# Patient Record
Sex: Male | Born: 1970 | Race: Black or African American | Hispanic: No | Marital: Single | Smoking: Never smoker
Health system: Southern US, Community
[De-identification: ages and names within clinical notes are randomized; demographics above are authoritative.]

## PROBLEM LIST (undated history)

## (undated) DIAGNOSIS — R1900 Intra-abdominal and pelvic swelling, mass and lump, unspecified site: Secondary | ICD-10-CM

## (undated) DIAGNOSIS — R1012 Left upper quadrant pain: Secondary | ICD-10-CM

## (undated) DIAGNOSIS — D1739 Benign lipomatous neoplasm of skin and subcutaneous tissue of other sites: Secondary | ICD-10-CM

## (undated) DIAGNOSIS — I10 Essential (primary) hypertension: Secondary | ICD-10-CM

## (undated) HISTORY — DX: Benign lipomatous neoplasm of skin and subcutaneous tissue of other sites: D17.39

## (undated) HISTORY — DX: Intra-abdominal and pelvic swelling, mass and lump, unspecified site: R19.00

## (undated) HISTORY — DX: Left upper quadrant pain: R10.12

## (undated) HISTORY — DX: Essential (primary) hypertension: I10

---

## 2004-11-22 HISTORY — PX: HAND SURGERY: SHX662

## 2012-03-23 ENCOUNTER — Ambulatory Visit (INDEPENDENT_AMBULATORY_CARE_PROVIDER_SITE_OTHER): Payer: Self-pay | Admitting: Surgery

## 2012-03-27 ENCOUNTER — Encounter (INDEPENDENT_AMBULATORY_CARE_PROVIDER_SITE_OTHER): Payer: Self-pay | Admitting: Surgery

## 2012-03-30 ENCOUNTER — Encounter (INDEPENDENT_AMBULATORY_CARE_PROVIDER_SITE_OTHER): Payer: Self-pay | Admitting: Surgery

## 2012-03-30 ENCOUNTER — Ambulatory Visit (INDEPENDENT_AMBULATORY_CARE_PROVIDER_SITE_OTHER): Payer: BC Managed Care – PPO | Admitting: Surgery

## 2012-03-30 VITALS — BP 122/78 | HR 80 | Resp 18 | Ht 67.0 in | Wt 233.6 lb

## 2012-03-30 DIAGNOSIS — R229 Localized swelling, mass and lump, unspecified: Secondary | ICD-10-CM

## 2012-03-30 DIAGNOSIS — R222 Localized swelling, mass and lump, trunk: Secondary | ICD-10-CM

## 2012-03-30 NOTE — Progress Notes (Signed)
Patient ID: Wayne White [facility], male   DOB: 02-22-1971, 41 y.o.   MRN: [id number]  Chief Complaint  Patient presents with  . Other    Eval lipoma on back    HPI Wayne White is a 41 y.o. male.   HPIThis is a pleasant gentleman referred by Dr. Katherine Mantle for evaluation of a large left back mass. The mass has been there for several years but is now quickly getting larger. It causes him a significant amount of discomfort. He describes the pain as an ache. It is not referring where else. He is otherwise without complaints. He has had no recent weight loss.  Past Medical History  Diagnosis Date  . Hypertension   . Abdominal pain, left upper quadrant   . Abdominal or pelvic swelling, mass or lump, unspecified site   . 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 Hand surgery     History reviewed. No pertinent family history.  Social History History  Substance Use Topics  . Smoking status: Never Smoker   . Smokeless tobacco: Not on file  . Alcohol Use: No    No Known Allergies  Current Outpatient Prescriptions  Medication Sig Dispense Refill  . amLODipine (NORVASC) 10 MG tablet Take 10 mg by mouth daily.      . metoprolol (LOPRESSOR) 50 MG tablet Take 100 mg by mouth 2 (two) times daily.       . hydrochlorothiazide (HYDRODIURIL) 25 MG tablet Take 25 mg by mouth daily.        Review of Systems Review of Systems  Constitutional: Negative for fever, chills and unexpected weight change.  HENT: Negative for hearing loss, congestion, sore throat, trouble swallowing and voice change.   Eyes: Negative for visual disturbance.  Respiratory: Negative for cough and wheezing.   Cardiovascular: Negative for chest pain, palpitations and leg swelling.  Gastrointestinal: Negative for nausea, vomiting, abdominal pain, diarrhea, constipation, blood in stool, abdominal distention, anal bleeding and rectal pain.  Genitourinary: Negative for hematuria and difficulty  urinating.  Musculoskeletal: Positive for back pain. Negative for arthralgias.  Skin: Negative for rash and wound.  Neurological: Negative for seizures, syncope, weakness and headaches.  Hematological: Negative for adenopathy. Does not bruise/bleed easily.  Psychiatric/Behavioral: Negative for confusion.    Blood pressure 122/78, pulse 80, resp. rate 18, height 5\' 7"  (1.702 m), weight 233 lb 9.6 oz (105.96 kg).  Physical Exam Physical Exam  Constitutional: He is oriented to person, place, and time. He appears well-developed and well-nourished. No distress.  HENT:  Head: Normocephalic and atraumatic.  Right Ear: External ear normal.  Left Ear: External ear normal.  Nose: Nose normal.  Mouth/Throat: Oropharynx is clear and moist. No oropharyngeal exudate.  Eyes: Conjunctivae are normal. Pupils are equal, round, and reactive to light. No scleral icterus.  Neck: Normal range of motion. No tracheal deviation present. No thyromegaly present.  Cardiovascular: Normal rate, regular rhythm, normal heart sounds and intact distal pulses.   No murmur heard. Pulmonary/Chest: Effort normal and breath sounds normal. No respiratory distress. He has no wheezes. He has no rales.  Abdominal: Soft. Bowel sounds are normal. He exhibits no distension. There is no tenderness. There is no rebound.  Musculoskeletal: Normal range of motion. He exhibits no edema and no tenderness.  Lymphadenopathy:    He has no cervical adenopathy.  Neurological: He is alert and oriented to person, place, and time.  Skin: Skin is warm  and dry. No rash noted. No erythema.  Psychiatric: His behavior is normal. Judgment normal.  Back: There is a large 8 cm slightly firm mass of left mid back. It is not mobile. There is no fluctuance or erythema  Data Reviewed   Assessment    8 cm left back mass of uncertain etiology    Plan    Removal in the operating room given its size and potential for malignancy is recommended. I  discussed the surgery with him in detail. I discussed the risk of surgery which includes but is not limited to bleeding, infection, recurrence, seroma formation, etc. I believe he will need to be out of work approximately 2 weeks postoperatively for the wound to heal correctly. Surgery will be scheduled. Likelihood of success is good       Wayne White A 03/30/2012, 9:19 AM

## 2012-04-28 ENCOUNTER — Encounter (HOSPITAL_COMMUNITY): Payer: Self-pay | Admitting: Pharmacy Technician

## 2012-05-02 ENCOUNTER — Encounter (HOSPITAL_COMMUNITY): Payer: Self-pay

## 2012-05-02 ENCOUNTER — Encounter (HOSPITAL_COMMUNITY)
Admission: RE | Admit: 2012-05-02 | Discharge: 2012-05-02 | Disposition: A | Discharge status: Home / Self Care | Payer: BC Managed Care – PPO | Source: Ambulatory Visit | Attending: Surgery | Admitting: Surgery

## 2012-05-02 ENCOUNTER — Ambulatory Visit (HOSPITAL_COMMUNITY)
Admission: RE | Admit: 2012-05-02 | Discharge: 2012-05-02 | Disposition: A | Discharge status: Home / Self Care | Payer: BC Managed Care – PPO | Source: Ambulatory Visit | Attending: Surgery | Admitting: Surgery

## 2012-05-02 ENCOUNTER — Other Ambulatory Visit: Payer: Self-pay

## 2012-05-02 DIAGNOSIS — I517 Cardiomegaly: Secondary | ICD-10-CM | POA: Insufficient documentation

## 2012-05-02 DIAGNOSIS — R911 Solitary pulmonary nodule: Secondary | ICD-10-CM | POA: Insufficient documentation

## 2012-05-02 DIAGNOSIS — L989 Disorder of the skin and subcutaneous tissue, unspecified: Secondary | ICD-10-CM | POA: Insufficient documentation

## 2012-05-02 DIAGNOSIS — Z01810 Encounter for preprocedural cardiovascular examination: Secondary | ICD-10-CM | POA: Insufficient documentation

## 2012-05-02 DIAGNOSIS — Z01812 Encounter for preprocedural laboratory examination: Secondary | ICD-10-CM | POA: Insufficient documentation

## 2012-05-02 LAB — CBC
HCT: 42 % (ref 39.0–52.0)
Hemoglobin: 13.9 g/dL (ref 13.0–17.0)
MCH: 28.7 pg (ref 26.0–34.0)
MCV: 86.8 fL (ref 78.0–100.0)
RBC: 4.84 MIL/uL (ref 4.22–5.81)

## 2012-05-02 LAB — BASIC METABOLIC PANEL
CO2: 28 mEq/L (ref 19–32)
Calcium: 9.3 mg/dL (ref 8.4–10.5)
Creatinine, Ser: 1.31 mg/dL (ref 0.50–1.35)
Glucose, Bld: 88 mg/dL (ref 70–99)

## 2012-05-02 NOTE — Patient Instructions (Signed)
[address] [facility]  05/02/2012   Your procedure is scheduled on:  05/11/12  Thursday  Surgery  [id number]  Report to [facility] at 0515      AM.  Call this number if you have problems the morning of surgery: [phone]     Or PST   [id number]  [facility]   Remember:   Do not eat food or fluids :After Midnight.  Wednesday NIGHT     Take these medicines the morning of surgery with A SIP OF WATER:   AMLODIPINE, METOPROLOL   Do not wear jewelry, make-up or nail polish.  Do not wear lotions, powders, or perfumes. You may wear deodorant.  Do not shave 48 hours prior to surgery.  Do not bring valuables to the hospital.  Contacts, dentures or bridgework may not be worn into surgery.  Leave suitcase in the car. After surgery it may be brought to your room.  For patients admitted to the hospital, checkout time is 11:00 AM the day of discharge.   Patients discharged the day of surgery will not be allowed to drive home.  Name and phone number of your driver:      father                                                                Special Instructions: CHG Shower Use Special Wash: 1/2 bottle night before surgery and 1/2 bottle morning of surgery. REGULAR SOAP FACE AND PRIVATES              L              MEN-MAY SHAVE FACE MORNING OF SURGERY  Please read over the following fact sheets that you were given: MRSA Information

## 2012-05-02 NOTE — Pre-Procedure Instructions (Signed)
Spoke with Britta Mccreedy at [facility] and notified her to have Dr Magnus Ivan review abnormal chest x ray- states he is out of office until 05/08/12 and will review when he returns

## 2012-05-10 NOTE — H&P (Signed)
Patient ID: Wayne White, male DOB: October 10, 1971, 41 y.o. MRN: [id number]  Chief Complaint   Patient presents with   .  Other     Eval lipoma on back    HPI  Wayne White is White 41 y.o. male.  HPIThis is White pleasant gentleman referred by Dr. Katherine Mantle for evaluation of White large left back mass. The mass has been there for several years but is now quickly getting larger. It causes him White significant amount of discomfort. He describes the pain as an ache. It is not referring where else. He is otherwise without complaints. He has had no recent weight loss.  Past Medical History   Diagnosis  Date   .  Hypertension    .  Abdominal pain, left upper quadrant    .  Abdominal or pelvic swelling, mass or lump, unspecified site    .  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  Hand surgery     History reviewed. No pertinent family history.  Social History  History   Substance Use Topics   .  Smoking status:  Never Smoker   .  Smokeless tobacco:  Not on file   .  Alcohol Use:  No    No Known Allergies  Current Outpatient Prescriptions   Medication  Sig  Dispense  Refill   .  amLODipine (NORVASC) 10 MG tablet  Take 10 mg by mouth daily.     .  metoprolol (LOPRESSOR) 50 MG tablet  Take 100 mg by mouth 2 (two) times daily.     .  hydrochlorothiazide (HYDRODIURIL) 25 MG tablet  Take 25 mg by mouth daily.      Review of Systems  Review of Systems  Constitutional: Negative for fever, chills and unexpected weight change.  HENT: Negative for hearing loss, congestion, sore throat, trouble swallowing and voice change.  Eyes: Negative for visual disturbance.  Respiratory: Negative for cough and wheezing.  Cardiovascular: Negative for chest pain, palpitations and leg swelling.  Gastrointestinal: Negative for nausea, vomiting, abdominal pain, diarrhea, constipation, blood in stool, abdominal distention, anal bleeding and rectal pain.  Genitourinary: Negative for  hematuria and difficulty urinating.  Musculoskeletal: Positive for back pain. Negative for arthralgias.  Skin: Negative for rash and wound.  Neurological: Negative for seizures, syncope, weakness and headaches.  Hematological: Negative for adenopathy. Does not bruise/bleed easily.  Psychiatric/Behavioral: Negative for confusion.   Blood pressure 122/78, pulse 80, resp. rate 18, height 5\' 7"  (1.702 m), weight 233 lb 9.6 oz (105.96 kg).  Physical Exam  Physical Exam  Constitutional: He is oriented to person, place, and time. He appears well-developed and well-nourished. No distress.  HENT:  Head: Normocephalic and atraumatic.  Right Ear: External ear normal.  Left Ear: External ear normal.  Nose: Nose normal.  Mouth/Throat: Oropharynx is clear and moist. No oropharyngeal exudate.  Eyes: Conjunctivae are normal. Pupils are equal, round, and reactive to light. No scleral icterus.  Neck: Normal range of motion. No tracheal deviation present. No thyromegaly present.  Cardiovascular: Normal rate, regular rhythm, normal heart sounds and intact distal pulses.  No murmur heard.  Pulmonary/Chest: Effort normal and breath sounds normal. No respiratory distress. He has no wheezes. He has no rales.  Abdominal: Soft. Bowel sounds are normal. He exhibits no distension. There is no tenderness. There is no rebound.  Musculoskeletal: Normal range of motion. He exhibits no edema and  no tenderness.  Lymphadenopathy:  He has no cervical adenopathy.  Neurological: He is alert and oriented to person, place, and time.  Skin: Skin is warm and dry. No rash noted. No erythema.  Psychiatric: His behavior is normal. Judgment normal.  Back: There is White large 8 cm slightly firm mass of left mid back. It is not mobile. There is no fluctuance or erythema  Data Reviewed  Assessment   8 cm left back mass of uncertain etiology   Plan   Removal in the operating room given its size and potential for malignancy is  recommended. I discussed the surgery with him in detail. I discussed the risk of surgery which includes but is not limited to bleeding, infection, recurrence, seroma formation, etc. I believe he will need to be out of work approximately 2 weeks postoperatively for the wound to heal correctly. Surgery will be scheduled. Likelihood of success is good   Wayne White

## 2012-05-11 ENCOUNTER — Encounter (HOSPITAL_COMMUNITY)
Admission: RE | Disposition: A | Discharge status: Home / Self Care | Payer: Self-pay | Source: Ambulatory Visit | Attending: Surgery

## 2012-05-11 ENCOUNTER — Encounter (HOSPITAL_COMMUNITY): Payer: Self-pay | Admitting: Anesthesiology

## 2012-05-11 ENCOUNTER — Ambulatory Visit (HOSPITAL_COMMUNITY): Payer: BC Managed Care – PPO | Admitting: Anesthesiology

## 2012-05-11 ENCOUNTER — Ambulatory Visit (HOSPITAL_COMMUNITY)
Admission: RE | Admit: 2012-05-11 | Discharge: 2012-05-11 | Disposition: A | Discharge status: Home / Self Care | Payer: BC Managed Care – PPO | Source: Ambulatory Visit | Attending: Surgery | Admitting: Surgery

## 2012-05-11 ENCOUNTER — Encounter (HOSPITAL_COMMUNITY): Payer: Self-pay | Admitting: *Deleted

## 2012-05-11 DIAGNOSIS — I10 Essential (primary) hypertension: Secondary | ICD-10-CM | POA: Insufficient documentation

## 2012-05-11 DIAGNOSIS — R1012 Left upper quadrant pain: Secondary | ICD-10-CM | POA: Insufficient documentation

## 2012-05-11 DIAGNOSIS — M549 Dorsalgia, unspecified: Secondary | ICD-10-CM | POA: Insufficient documentation

## 2012-05-11 DIAGNOSIS — D1739 Benign lipomatous neoplasm of skin and subcutaneous tissue of other sites: Secondary | ICD-10-CM

## 2012-05-11 DIAGNOSIS — Z79899 Other long term (current) drug therapy: Secondary | ICD-10-CM | POA: Insufficient documentation

## 2012-05-11 HISTORY — PX: MASS EXCISION: SHX2000

## 2012-05-11 SURGERY — EXCISION MASS
Anesthesia: General | Site: Back | Wound class: Clean

## 2012-05-11 MED ORDER — CEFAZOLIN SODIUM-DEXTROSE 2-3 GM-% IV SOLR
INTRAVENOUS | Status: AC
  Filled 2012-05-11: qty 50

## 2012-05-11 MED ORDER — FENTANYL CITRATE 0.05 MG/ML IJ SOLN: 25.0000 ug | INTRAMUSCULAR | Status: DC | PRN

## 2012-05-11 MED ORDER — LIDOCAINE HCL (CARDIAC) 20 MG/ML IV SOLN
INTRAVENOUS | Status: DC | PRN
  Administered 2012-05-11: 50 mg via INTRAVENOUS

## 2012-05-11 MED ORDER — BUPIVACAINE HCL (PF) 0.5 % IJ SOLN
INTRAMUSCULAR | Status: DC | PRN
  Administered 2012-05-11: 28 mL

## 2012-05-11 MED ORDER — ONDANSETRON HCL 4 MG/2ML IJ SOLN
INTRAMUSCULAR | Status: DC | PRN
  Administered 2012-05-11: 4 mg via INTRAVENOUS

## 2012-05-11 MED ORDER — PROMETHAZINE HCL 25 MG/ML IJ SOLN: 6.2500 mg | INTRAMUSCULAR | Status: DC | PRN

## 2012-05-11 MED ORDER — FENTANYL CITRATE 0.05 MG/ML IJ SOLN
INTRAMUSCULAR | Status: DC | PRN
  Administered 2012-05-11: 100 ug via INTRAVENOUS
  Administered 2012-05-11: 50 ug via INTRAVENOUS

## 2012-05-11 MED ORDER — CEFAZOLIN SODIUM-DEXTROSE 2-3 GM-% IV SOLR
2.0000 g | Freq: Once | INTRAVENOUS | Status: AC
  Administered 2012-05-11: 2 g via INTRAVENOUS

## 2012-05-11 MED ORDER — SUCCINYLCHOLINE CHLORIDE 20 MG/ML IJ SOLN
INTRAMUSCULAR | Status: DC | PRN
  Administered 2012-05-11: 160 mg via INTRAVENOUS

## 2012-05-11 MED ORDER — PROPOFOL 10 MG/ML IV BOLUS
INTRAVENOUS | Status: DC | PRN
  Administered 2012-05-11: 200 mg via INTRAVENOUS

## 2012-05-11 MED ORDER — LACTATED RINGERS IV SOLN
INTRAVENOUS | Status: DC | PRN
  Administered 2012-05-11: 07:00:00 via INTRAVENOUS

## 2012-05-11 MED ORDER — KETOROLAC TROMETHAMINE 30 MG/ML IJ SOLN
INTRAMUSCULAR | Status: DC | PRN
  Administered 2012-05-11: 30 mg via INTRAVENOUS

## 2012-05-11 MED ORDER — MIDAZOLAM HCL 5 MG/5ML IJ SOLN
INTRAMUSCULAR | Status: DC | PRN
  Administered 2012-05-11: 2 mg via INTRAVENOUS

## 2012-05-11 MED ORDER — LACTATED RINGERS IV SOLN: INTRAVENOUS | Status: DC

## 2012-05-11 MED ORDER — BUPIVACAINE HCL (PF) 0.5 % IJ SOLN
INTRAMUSCULAR | Status: AC
  Filled 2012-05-11: qty 30

## 2012-05-11 MED ORDER — HYDROCODONE-ACETAMINOPHEN 5-325 MG PO TABS: 1.0000 | ORAL_TABLET | ORAL | Status: AC | PRN

## 2012-05-11 MED ORDER — 0.9 % SODIUM CHLORIDE (POUR BTL) OPTIME
TOPICAL | Status: DC | PRN
  Administered 2012-05-11: 1000 mL

## 2012-05-11 SURGICAL SUPPLY — 43 items
BENZOIN TINCTURE PRP APPL 2/3 (GAUZE/BANDAGES/DRESSINGS) ×2 IMPLANT
BLADE HEX COATED 2.75 (ELECTRODE) ×2 IMPLANT
BLADE SURG 15 STRL LF DISP TIS (BLADE) ×1 IMPLANT
BLADE SURG 15 STRL SS (BLADE) ×1
BLADE SURG SZ10 CARB STEEL (BLADE) ×2 IMPLANT
CANISTER SUCTION 2500CC (MISCELLANEOUS) ×2 IMPLANT
CHLORAPREP W/TINT 26ML (MISCELLANEOUS) ×2 IMPLANT
CLOTH BEACON ORANGE TIMEOUT ST (SAFETY) ×2 IMPLANT
CLSR STERI-STRIP ANTIMIC 1/2X4 (GAUZE/BANDAGES/DRESSINGS) ×2 IMPLANT
DECANTER SPIKE VIAL GLASS SM (MISCELLANEOUS) ×2 IMPLANT
DERMABOND ADVANCED (GAUZE/BANDAGES/DRESSINGS)
DERMABOND ADVANCED .7 DNX12 (GAUZE/BANDAGES/DRESSINGS) IMPLANT
DRAIN PENROSE 18X1/2 LTX STRL (DRAIN) ×2 IMPLANT
DRAPE LAPAROTOMY TRNSV 102X78 (DRAPE) ×2 IMPLANT
DRSG PAD ABDOMINAL 8X10 ST (GAUZE/BANDAGES/DRESSINGS) ×2 IMPLANT
DRSG TEGADERM 4X4.75 (GAUZE/BANDAGES/DRESSINGS) ×2 IMPLANT
ELECT REM PT RETURN 9FT ADLT (ELECTROSURGICAL) ×2
ELECTRODE REM PT RTRN 9FT ADLT (ELECTROSURGICAL) ×1 IMPLANT
GAUZE SPONGE 4X4 16PLY XRAY LF (GAUZE/BANDAGES/DRESSINGS) ×2 IMPLANT
GLOVE SURG SIGNA 7.5 PF LTX (GLOVE) ×4 IMPLANT
GOWN STRL NON-REIN LRG LVL3 (GOWN DISPOSABLE) ×2 IMPLANT
GOWN STRL REIN XL XLG (GOWN DISPOSABLE) ×4 IMPLANT
KIT BASIN OR (CUSTOM PROCEDURE TRAY) ×2 IMPLANT
NEEDLE HYPO 22GX1.5 SAFETY (NEEDLE) ×2 IMPLANT
NEEDLE HYPO 25X1 1.5 SAFETY (NEEDLE) ×2 IMPLANT
NS IRRIG 1000ML POUR BTL (IV SOLUTION) ×2 IMPLANT
PACK BASIC VI WITH GOWN DISP (CUSTOM PROCEDURE TRAY) ×2 IMPLANT
PENCIL BUTTON HOLSTER BLD 10FT (ELECTRODE) ×2 IMPLANT
SPONGE GAUZE 4X4 12PLY (GAUZE/BANDAGES/DRESSINGS) ×4 IMPLANT
SPONGE LAP 4X18 X RAY DECT (DISPOSABLE) ×2 IMPLANT
STRIP CLOSURE SKIN 1/2X4 (GAUZE/BANDAGES/DRESSINGS) IMPLANT
SUT MNCRL AB 4-0 PS2 18 (SUTURE) ×4 IMPLANT
SUT VIC AB 2-0 CT1 27 (SUTURE) ×2
SUT VIC AB 2-0 CT1 TAPERPNT 27 (SUTURE) ×2 IMPLANT
SUT VIC AB 3-0 54XBRD REEL (SUTURE) IMPLANT
SUT VIC AB 3-0 BRD 54 (SUTURE)
SUT VIC AB 3-0 SH 27 (SUTURE)
SUT VIC AB 3-0 SH 27XBRD (SUTURE) IMPLANT
SYR BULB IRRIGATION 50ML (SYRINGE) ×2 IMPLANT
SYR CONTROL 10ML LL (SYRINGE) ×2 IMPLANT
TAPE CLOTH SURG 4X10 WHT LF (GAUZE/BANDAGES/DRESSINGS) ×2 IMPLANT
TOWEL OR 17X26 10 PK STRL BLUE (TOWEL DISPOSABLE) ×4 IMPLANT
YANKAUER SUCT BULB TIP 10FT TU (MISCELLANEOUS) ×2 IMPLANT

## 2012-05-11 NOTE — Discharge Instructions (Signed)
May shower starting tomorrow with bandage on  May remove bandage Saturday  Leave steri strips on  Ice pack and ibuprofen as well for pain

## 2012-05-11 NOTE — Anesthesia Postprocedure Evaluation (Signed)
  Anesthesia Post-op Note  Patient: Wayne White [facility]  Procedure(s) Performed: Procedure(s) (LRB): EXCISION MASS (N/A)  Patient Location: PACU  Anesthesia Type: General  Level of Consciousness: awake and alert   Airway and Oxygen Therapy: Patient Spontanous Breathing  Post-op Pain: mild  Post-op Assessment: Post-op Vital signs reviewed, Patient's Cardiovascular Status Stable, Respiratory Function Stable, Patent Airway and No signs of Nausea or vomiting  Post-op Vital Signs: stable  Complications: No apparent anesthesia complications

## 2012-05-11 NOTE — Interval H&P Note (Signed)
History and Physical Interval Note:  No change in history or exam  05/11/2012 6:59 AM  Wayne White  has presented today for surgery, with the diagnosis of left back mass  The various methods of treatment have been discussed with the patient and family. After consideration of risks, benefits and other options for treatment, the patient has consented to  Procedure(s) (LRB): EXCISION MASS (N/A) as a surgical intervention .  The patient's history has been reviewed, patient examined, no change in status, stable for surgery.  I have reviewed the patients' chart and labs.  Questions were answered to the patient's satisfaction.     Drexler Maland A

## 2012-05-11 NOTE — Transfer of Care (Signed)
Immediate Anesthesia Transfer of Care Note  Patient: Wayne White [facility]  Procedure(s) Performed: Procedure(s) (LRB): EXCISION MASS (N/A)  Patient Location: PACU  Anesthesia Type: General  Level of Consciousness: sedated, patient cooperative and responds to stimulaton  Airway & Oxygen Therapy: Patient Spontanous Breathing and Patient connected to face mask oxgen  Post-op Assessment: Report given to PACU RN and Post -op Vital signs reviewed and stable  Post vital signs: Reviewed and stable  Complications: No apparent anesthesia complications

## 2012-05-11 NOTE — Op Note (Signed)
EXCISION MASS  Procedure Note  Avan Gullett [facility] 05/11/2012   Pre-op Diagnosis: left back mass     Post-op Diagnosis: 15cm x 10cm left back mass  Procedure(s): EXCISION 15CM X 10CM LEFT BACK MASS  Surgeon(s): Shelly Rubenstein, MD  Anesthesia: General  Staff:  Jaynee Eagles - Circulator April C McKinney, CST - Scrub Person [place], [place] - Scrub Person  Estimated Blood Loss: Minimal               Specimens:  Mass consistent with lipoma sent to path         Procedure: The patient was brought to the operating room and identified as the correct patient. He was placed supine on the operating room table and general anesthesia was placed. The patient was then turned to the prone position. His back was then prepped and draped in the usual sterile fashion. I made a longitudinal incision over the top of the mass which was just to the left of the midline. I took this down into the subcutaneous tissue with electrocautery. The large mass was consistent with a lipoma. I was able to excise it in its entirety with the cautery. The mass measured roughly 15 cm x 10 cm in size. It was sent to pathology for evaluation. I then irrigated with saline. I anesthetized the wound with Marcaine. I then closed the subcutaneous tissue with interrupted 2-0 Vicryl sutures and closed the skin with a running 4-0 Monocryl suture. Steri-Strips, gauze, and Tegaderm were then applied. The patient tolerated the procedure well. All the counts were correct at the end of the procedure. The patient was then extubated in the operating room and taken in a stable condition to the recovery room. Mari Battaglia A   Date: 05/11/2012  Time: 8:04 AM

## 2012-05-11 NOTE — Anesthesia Preprocedure Evaluation (Signed)
Anesthesia Evaluation  Patient identified by MRN, date of birth, ID band Patient awake    Reviewed: Allergy & Precautions, H&P , NPO status , Patient's Chart, lab work & pertinent test results  Airway Mallampati: II TM Distance: <3 FB Neck ROM: Full    Dental No notable dental hx.    Pulmonary neg pulmonary ROS,  breath sounds clear to auscultation  Pulmonary exam normal       Cardiovascular hypertension, Pt. on medications Rhythm:Regular Rate:Normal     Neuro/Psych negative neurological ROS  negative psych ROS   GI/Hepatic negative GI ROS, Neg liver ROS,   Endo/Other  Morbid obesity  Renal/GU negative Renal ROS  negative genitourinary   Musculoskeletal negative musculoskeletal ROS (+)   Abdominal   Peds negative pediatric ROS (+)  Hematology negative hematology ROS (+)   Anesthesia Other Findings   Reproductive/Obstetrics negative OB ROS                           Anesthesia Physical Anesthesia Plan  ASA: II  Anesthesia Plan: General   Post-op Pain Management:    Induction: Intravenous  Airway Management Planned: Oral ETT  Additional Equipment:   Intra-op Plan:   Post-operative Plan: Extubation in OR  Informed Consent: I have reviewed the patients History and Physical, chart, labs and discussed the procedure including the risks, benefits and alternatives for the proposed anesthesia with the patient or authorized representative who has indicated his/her understanding and acceptance.   Dental advisory given  Plan Discussed with: CRNA  Anesthesia Plan Comments:         Anesthesia Quick Evaluation

## 2012-05-12 ENCOUNTER — Encounter (HOSPITAL_COMMUNITY): Payer: Self-pay | Admitting: Surgery

## 2012-05-26 ENCOUNTER — Ambulatory Visit (INDEPENDENT_AMBULATORY_CARE_PROVIDER_SITE_OTHER): Payer: BC Managed Care – PPO | Admitting: Surgery

## 2012-05-26 ENCOUNTER — Encounter (INDEPENDENT_AMBULATORY_CARE_PROVIDER_SITE_OTHER): Payer: Self-pay | Admitting: Surgery

## 2012-05-26 VITALS — BP 126/88 | HR 48 | Temp 98.0°F | Resp 16 | Ht 67.0 in | Wt 224.0 lb

## 2012-05-26 DIAGNOSIS — Z09 Encounter for follow-up examination after completed treatment for conditions other than malignant neoplasm: Secondary | ICD-10-CM

## 2012-05-26 NOTE — Progress Notes (Signed)
Subjective:     Patient ID: Wayne White [facility], male   DOB: [birth year], 41 y.o.   MRN: [id number]  HPI He is here for his first postop visit status post excision of a very large lipoma on his back. He reports discomfort at the site  Review of Systems     Objective:   Physical Exam On exam, there appeared to be a large seroma but after I inserted a needle it became apparent this is a postop hematoma which is not completely liquefied    Assessment:     Patient status post excision of large lipoma of postop hematoma    Plan:     I will see him back in approximately 3 weeks to see if this can be needle aspirated. He will be out of work until at least July 29

## 2012-05-31 ENCOUNTER — Encounter (INDEPENDENT_AMBULATORY_CARE_PROVIDER_SITE_OTHER): Payer: Self-pay | Admitting: General Surgery

## 2012-05-31 ENCOUNTER — Telehealth (INDEPENDENT_AMBULATORY_CARE_PROVIDER_SITE_OTHER): Payer: Self-pay

## 2012-05-31 NOTE — Telephone Encounter (Signed)
That’s fine with me

## 2012-05-31 NOTE — Telephone Encounter (Signed)
Patient called saying Dr. Magnus Ivan wrote him out of work at least until July 29th. He wants to go back sooner; like 1 week sooner than that. Told patient I would send message to dr and nurse and someone would get back with him.

## 2012-06-15 ENCOUNTER — Ambulatory Visit (INDEPENDENT_AMBULATORY_CARE_PROVIDER_SITE_OTHER): Payer: BC Managed Care – PPO | Admitting: Surgery

## 2012-06-15 ENCOUNTER — Encounter (INDEPENDENT_AMBULATORY_CARE_PROVIDER_SITE_OTHER): Payer: Self-pay | Admitting: Surgery

## 2012-06-15 VITALS — BP 123/85 | HR 77 | Temp 97.7°F | Resp 17 | Ht 67.0 in | Wt 227.6 lb

## 2012-06-15 DIAGNOSIS — Z09 Encounter for follow-up examination after completed treatment for conditions other than malignant neoplasm: Secondary | ICD-10-CM

## 2012-06-15 NOTE — Progress Notes (Signed)
Subjective:     Patient ID: Wayne White [facility], male   DOB: December 02, 1970, 41 y.o.   MRN: [id number]  HPI He is here for another postop visit. He has no complaints  Review of Systems     Objective:   Physical Exam There is still a large seroma/hematoma present. I tried to aspirate it was unsuccessful so I made a small incision and then was able to get out most of the hematoma. I then close the wound Steri-Strips    Assessment:     Patient status post excision of large lipoma with postop hematoma    Plan:     I will see him back in 3 weeks. He will remove the bandage morbid continue to leave the Steri-Strips in place

## 2012-06-28 ENCOUNTER — Encounter (INDEPENDENT_AMBULATORY_CARE_PROVIDER_SITE_OTHER): Payer: BC Managed Care – PPO | Admitting: Surgery

## 2015-01-17 ENCOUNTER — Emergency Department (HOSPITAL_COMMUNITY): Payer: BLUE CROSS/BLUE SHIELD

## 2015-01-17 ENCOUNTER — Encounter (HOSPITAL_COMMUNITY): Payer: Self-pay | Admitting: Emergency Medicine

## 2015-01-17 ENCOUNTER — Emergency Department (HOSPITAL_COMMUNITY)
Admission: EM | Admit: 2015-01-17 | Discharge: 2015-01-17 | Disposition: A | Discharge status: Home / Self Care | Payer: BLUE CROSS/BLUE SHIELD | Attending: Emergency Medicine | Admitting: Emergency Medicine

## 2015-01-17 DIAGNOSIS — S199XXA Unspecified injury of neck, initial encounter: Secondary | ICD-10-CM | POA: Diagnosis present

## 2015-01-17 DIAGNOSIS — Z872 Personal history of diseases of the skin and subcutaneous tissue: Secondary | ICD-10-CM | POA: Insufficient documentation

## 2015-01-17 DIAGNOSIS — S161XXA Strain of muscle, fascia and tendon at neck level, initial encounter: Secondary | ICD-10-CM | POA: Diagnosis not present

## 2015-01-17 DIAGNOSIS — Y92410 Unspecified street and highway as the place of occurrence of the external cause: Secondary | ICD-10-CM | POA: Diagnosis not present

## 2015-01-17 DIAGNOSIS — Y998 Other external cause status: Secondary | ICD-10-CM | POA: Insufficient documentation

## 2015-01-17 DIAGNOSIS — I10 Essential (primary) hypertension: Secondary | ICD-10-CM | POA: Insufficient documentation

## 2015-01-17 DIAGNOSIS — Y9389 Activity, other specified: Secondary | ICD-10-CM | POA: Diagnosis not present

## 2015-01-17 DIAGNOSIS — Z79899 Other long term (current) drug therapy: Secondary | ICD-10-CM | POA: Insufficient documentation

## 2015-01-17 DIAGNOSIS — M62838 Other muscle spasm: Secondary | ICD-10-CM

## 2015-01-17 DIAGNOSIS — M5030 Other cervical disc degeneration, unspecified cervical region: Secondary | ICD-10-CM | POA: Insufficient documentation

## 2015-01-17 MED ORDER — CYCLOBENZAPRINE HCL 10 MG PO TABS: 10.0000 mg | ORAL_TABLET | Freq: Three times a day (TID) | ORAL | Status: AC | PRN

## 2015-01-17 MED ORDER — OXYCODONE-ACETAMINOPHEN 5-325 MG PO TABS: 1.0000 | ORAL_TABLET | Freq: Four times a day (QID) | ORAL | Status: AC | PRN

## 2015-01-17 MED ORDER — NAPROXEN 500 MG PO TABS: 500.0000 mg | ORAL_TABLET | Freq: Two times a day (BID) | ORAL | Status: AC | PRN

## 2015-01-17 MED ORDER — OXYCODONE-ACETAMINOPHEN 5-325 MG PO TABS
1.0000 | ORAL_TABLET | Freq: Once | ORAL | Status: AC
  Administered 2015-01-17: 1 via ORAL
  Filled 2015-01-17: qty 1

## 2015-01-17 NOTE — ED Notes (Signed)
Mercedes, PA at bedside. 
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
## 2015-01-17 NOTE — ED Notes (Signed)
Patient transported to X-ray 
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
## 2015-01-17 NOTE — Discharge Instructions (Signed)
Take naprosyn as directed for inflammation and pain with percocet for breakthrough pain and flexeril for muscle relaxation. Do not drive or operate machinery with pain medication or muscle relaxation use. Use heat to areas of soreness for the next few days. Follow up with primary care physician for recheck of ongoing symptoms. Return to ER for emergent changing or worsening of symptoms.     Cervical Sprain A cervical sprain is when the tissues (ligaments) that hold the neck bones in place stretch or tear. HOME CARE   Put ice on the injured area.  Put ice in a plastic bag.  Place a towel between your skin and the bag.  Leave the ice on for 15-20 minutes, 3-4 times a day.  You may have been given a collar to wear. This collar keeps your neck from moving while you heal.  Do not take the collar off unless told by your doctor.  If you have long hair, keep it outside of the collar.  Ask your doctor before changing the position of your collar. You may need to change its position over time to make it more comfortable.  If you are allowed to take off the collar for cleaning or bathing, follow your doctor's instructions on how to do it safely.  Keep your collar clean by wiping it with mild soap and water. Dry it completely. If the collar has removable pads, remove them every 1-2 days to hand wash them with soap and water. Allow them to air dry. They should be dry before you wear them in the collar.  Do not drive while wearing the collar.  Only take medicine as told by your doctor.  Keep all doctor visits as told.  Keep all physical therapy visits as told.  Adjust your work station so that you have good posture while you work.  Avoid positions and activities that make your problems worse.  Warm up and stretch before being active. GET HELP IF:  Your pain is not controlled with medicine.  You cannot take less pain medicine over time as planned.  Your activity level does not improve  as expected. GET HELP RIGHT AWAY IF:   You are bleeding.  Your stomach is upset.  You have an allergic reaction to your medicine.  You develop new problems that you cannot explain.  You lose feeling (become numb) or you cannot move any part of your body (paralysis).  You have tingling or weakness in any part of your body.  Your symptoms get worse. Symptoms include:  Pain, soreness, stiffness, puffiness (swelling), or a burning feeling in your neck.  Pain when your neck is touched.  Shoulder or upper back pain.  Limited ability to move your neck.  Headache.  Dizziness.  Your hands or arms feel week, lose feeling, or tingle.  Muscle spasms.  Difficulty swallowing or chewing. MAKE SURE YOU:   Understand these instructions.  Will watch your condition.  Will get help right away if you are not doing well or get worse. Document Released: 04/26/2008 Document Revised: 07/11/2013 Document Reviewed: 05/16/2013 [facility] Patient Information 2015 [place], [place]. This information is not intended to replace advice given to you by your health care provider. Make sure you discuss any questions you have with your health care provider.  Degenerative Disk Disease Degenerative disk disease is a condition caused by the changes that occur in the cushions of the backbone (spinal disks) as you grow older. Spinal disks are soft and compressible disks located between the  bones of the spine (vertebrae). They act like shock absorbers. Degenerative disk disease can affect the whole spine. However, the neck and lower back are most commonly affected. Many changes can occur in the spinal disks with aging, such as:  The spinal disks may dry and shrink.  Small tears may occur in the tough, outer covering of the disk (annulus).  The disk space may become smaller due to loss of water.  Abnormal growths in the bone (spurs) may occur. This can put pressure on the nerve roots exiting the spinal canal,  causing pain.  The spinal canal may become narrowed. CAUSES  Degenerative disk disease is a condition caused by the changes that occur in the spinal disks with aging. The exact cause is not known, but there is a genetic basis for many patients. Degenerative changes can occur due to loss of fluid in the disk. This makes the disk thinner and reduces the space between the backbones. Small cracks can develop in the outer layer of the disk. This can lead to the breakdown of the disk. You are more likely to get degenerative disk disease if you are overweight. Smoking cigarettes and doing heavy work such as weightlifting can also increase your risk of this condition. Degenerative changes can start after a sudden injury. Growth of bone spurs can compress the nerve roots and cause pain.  SYMPTOMS  The symptoms vary from person to person. Some people may have no pain, while others have severe pain. The pain may be so severe that it can limit your activities. The location of the pain depends on the part of your backbone that is affected. You will have neck or arm pain if a disk in the neck area is affected. You will have pain in your back, buttocks, or legs if a disk in the lower back is affected. The pain becomes worse while bending, reaching up, or with twisting movements. The pain may start gradually and then get worse as time passes. It may also start after a major or minor injury. You may feel numbness or tingling in the arms or legs.  DIAGNOSIS  Your caregiver will ask you about your symptoms and about activities or habits that may cause the pain. He or she may also ask about any injuries, diseases, or treatments you have had earlier. Your caregiver will examine you to check for the range of movement that is possible in the affected area, to check for strength in your extremities, and to check for sensation in the areas of the arms and legs supplied by different nerve roots. An X-ray of the spine may be taken.  Your caregiver may suggest other imaging tests, such as magnetic resonance imaging (MRI), if needed.  TREATMENT  Treatment includes rest, modifying your activities, and applying ice and heat. Your caregiver may prescribe medicines to reduce your pain and may ask you to do some exercises to strengthen your back. In some cases, you may need surgery. You and your caregiver will decide on the treatment that is best for you. HOME CARE INSTRUCTIONS   Follow proper lifting and walking techniques as advised by your caregiver.  Maintain good posture.  Exercise regularly as advised.  Perform relaxation exercises.  Change your sitting, standing, and sleeping habits as advised. Change positions frequently.  Lose weight as advised.  Stop smoking if you smoke.  Wear supportive footwear. SEEK MEDICAL CARE IF:  Your pain does not go away within 1 to 4 weeks. [place]  IF:   Your pain is severe.  You notice weakness in your arms, hands, or legs.  You begin to lose control of your bladder or bowel movements. MAKE SURE YOU:   Understand these instructions.  Will watch your condition.  Will get help right away if you are not doing well or get worse. Document Released: 09/05/2007 Document Revised: 01/31/2012 Document Reviewed: 03/12/2014 [facility] Patient Information 2015 [place], [place]. This information is not intended to replace advice given to you by your health care provider. Make sure you discuss any questions you have with your health care provider.  Heat Therapy Heat therapy can help make painful, stiff muscles and joints feel better. Do not use heat on new injuries. Wait at least 48 hours after an injury to use heat. Do not use heat when you have aches or pains right after an activity. If you still have pain 3 hours after stopping the activity, then you may use heat. HOME CARE Wet heat pack  Soak a clean towel in warm water. Squeeze out the extra water.  Put the  warm, wet towel in a plastic bag.  Place a thin, dry towel between your skin and the bag.  Put the heat pack on the area for 5 minutes, and check your skin. Your skin may be pink, but it should not be red.  Leave the heat pack on the area for 15 to 30 minutes.  Repeat this every 2 to 4 hours while awake. Do not use heat while you are sleeping. Warm water bath  Fill a tub with warm water.  Place the affected body part in the tub.  Soak the area for 20 to 40 minutes.  Repeat as needed. Hot water bottle  Fill the water bottle half full with hot water.  Press out the extra air. Close the cap tightly.  Place a dry towel between your skin and the bottle.  Put the bottle on the area for 5 minutes, and check your skin. Your skin may be pink, but it should not be red.  Leave the bottle on the area for 15 to 30 minutes.  Repeat this every 2 to 4 hours while awake. Electric heating pad  Place a dry towel between your skin and the heating pad.  Set the heating pad on low heat.  Put the heating pad on the area for 10 minutes, and check your skin. Your skin may be pink, but it should not be red.  Leave the heating pad on the area for 20 to 40 minutes.  Repeat this every 2 to 4 hours while awake.  Do not lie on the heating pad.  Do not fall asleep while using the heating pad.  Do not use the heating pad near water. GET HELP RIGHT AWAY IF:  You get blisters or red skin.  Your skin is puffy (swollen), or you lose feeling (numbness) in the affected area.  You have any new problems.  Your problems are getting worse.  You have any questions or concerns. If you have any problems, stop using heat therapy until you see your doctor. MAKE SURE YOU:  Understand these instructions.  Will watch your condition.  Will get help right away if you are not doing well or get worse. Document Released: 01/31/2012 Document Reviewed: 01/01/2014 [facility] Patient Information 2015  [place]. This information is not intended to replace advice given to you by your health care provider. Make sure you discuss any questions you have with your  health care provider. ° °
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
